# Patient Record
Sex: Female | Born: 1955 | Marital: Married
Health system: Southern US, Community
[De-identification: ages and names within clinical notes are randomized; demographics above are authoritative.]

---

## 2005-06-22 ENCOUNTER — Emergency Department: Payer: Self-pay | Admitting: Emergency Medicine

## 2006-08-23 ENCOUNTER — Emergency Department: Payer: Self-pay | Admitting: Emergency Medicine

## 2007-05-30 ENCOUNTER — Ambulatory Visit: Payer: Self-pay | Admitting: Family Medicine

## 2007-06-06 ENCOUNTER — Emergency Department: Payer: Self-pay | Admitting: Emergency Medicine

## 2007-07-21 ENCOUNTER — Emergency Department: Payer: Self-pay | Admitting: Emergency Medicine

## 2007-07-21 ENCOUNTER — Other Ambulatory Visit: Payer: Self-pay

## 2007-07-26 ENCOUNTER — Inpatient Hospital Stay: Payer: Self-pay | Admitting: Internal Medicine

## 2007-07-26 ENCOUNTER — Other Ambulatory Visit: Payer: Self-pay

## 2008-06-14 ENCOUNTER — Emergency Department: Payer: Self-pay | Admitting: Emergency Medicine

## 2008-06-14 ENCOUNTER — Other Ambulatory Visit: Payer: Self-pay

## 2008-09-30 ENCOUNTER — Inpatient Hospital Stay: Payer: Self-pay | Admitting: Internal Medicine

## 2008-10-26 ENCOUNTER — Inpatient Hospital Stay: Payer: Self-pay | Admitting: Vascular Surgery

## 2009-01-22 ENCOUNTER — Emergency Department: Payer: Self-pay | Admitting: Emergency Medicine

## 2010-03-21 ENCOUNTER — Inpatient Hospital Stay: Payer: Self-pay | Admitting: Internal Medicine

## 2010-05-13 IMAGING — CR DG CHEST 1V PORT
1 series · 1 of 1 positions shown · non-contrast
Comparison: none

REASON FOR EXAM: Shortness of breath
COMMENTS:

[view not recorded]
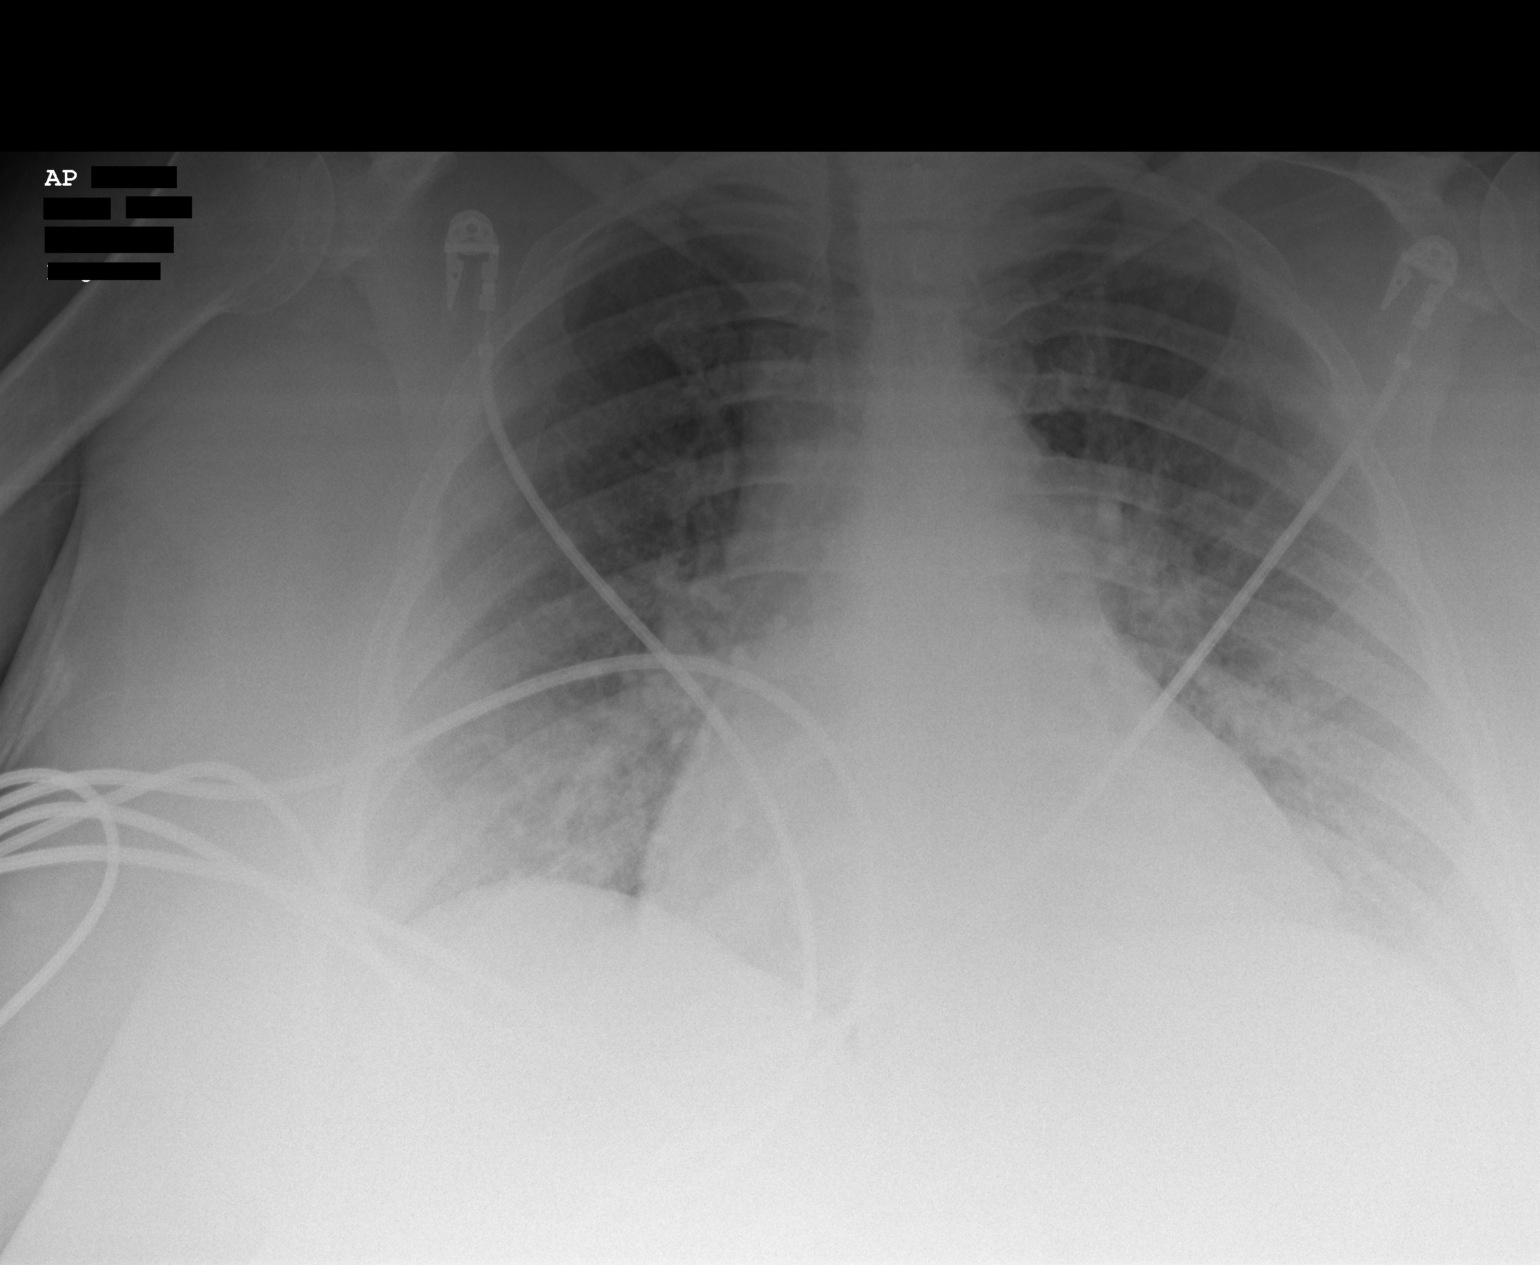

[1 of 1 positions shown; findings below may reference images not displayed]

PROCEDURE:     DXR - DXR PORTABLE CHEST SINGLE VIEW  - September 30, 2008  [DATE]

RESULT:     Comparison is made to a prior study dated 06/14/2008.

There is mild thickening of the interstitial markings without focal regions
of consolidation or focal infiltrates. The patient has taken a shallow
inspiration. The cardiac silhouette is moderately enlarged. The visualized
bony skeleton demonstrates no evidence of fracture or dislocation.
IMPRESSION: Chest radiograph without evidence of acute cardiopulmonary
disease.

## 2010-10-30 ENCOUNTER — Inpatient Hospital Stay: Payer: Self-pay | Admitting: Internal Medicine

## 2010-11-13 ENCOUNTER — Emergency Department: Payer: Self-pay | Admitting: Emergency Medicine

## 2011-07-22 ENCOUNTER — Emergency Department: Payer: Self-pay | Admitting: Emergency Medicine

## 2011-08-14 ENCOUNTER — Ambulatory Visit: Payer: Self-pay | Admitting: Internal Medicine

## 2011-09-04 ENCOUNTER — Ambulatory Visit: Payer: Self-pay | Admitting: Internal Medicine

## 2011-12-14 ENCOUNTER — Inpatient Hospital Stay: Payer: Self-pay | Admitting: Specialist

## 2011-12-14 LAB — CBC
HCT: 28.5 % — ABNORMAL LOW (ref 35.0–47.0)
HGB: 9.3 g/dL — ABNORMAL LOW (ref 12.0–16.0)
MCH: 27.8 pg (ref 26.0–34.0)
MCHC: 32.6 g/dL (ref 32.0–36.0)
MCV: 85 fL (ref 80–100)
Platelet: 227 10*3/uL (ref 150–440)
RDW: 14.8 % — ABNORMAL HIGH (ref 11.5–14.5)
WBC: 9.2 10*3/uL (ref 3.6–11.0)

## 2011-12-14 LAB — COMPREHENSIVE METABOLIC PANEL
Alkaline Phosphatase: 45 U/L — ABNORMAL LOW (ref 50–136)
Bilirubin,Total: 0.3 mg/dL (ref 0.2–1.0)
Calcium, Total: 8.5 mg/dL (ref 8.5–10.1)
Chloride: 97 mmol/L — ABNORMAL LOW (ref 98–107)
Creatinine: 3.44 mg/dL — ABNORMAL HIGH (ref 0.60–1.30)
EGFR (African American): 18 — ABNORMAL LOW
Glucose: 200 mg/dL — ABNORMAL HIGH (ref 65–99)
Potassium: 3.9 mmol/L (ref 3.5–5.1)
Sodium: 139 mmol/L (ref 136–145)
Total Protein: 6.5 g/dL (ref 6.4–8.2)

## 2011-12-14 LAB — IRON AND TIBC
Iron Bind.Cap.(Total): 327 ug/dL (ref 250–450)
Iron Saturation: 19 %
Iron: 63 ug/dL (ref 50–170)
Unbound Iron-Bind.Cap.: 264 ug/dL

## 2011-12-14 LAB — FERRITIN: Ferritin (ARMC): 48 ng/mL (ref 8–388)

## 2011-12-14 LAB — URINALYSIS, COMPLETE
Glucose,UR: 50 mg/dL (ref 0–75)
Hyaline Cast: 4
Nitrite: NEGATIVE
RBC,UR: 6 /HPF (ref 0–5)
Specific Gravity: 1.013 (ref 1.003–1.030)
WBC UR: 4 /HPF (ref 0–5)

## 2011-12-14 LAB — CK TOTAL AND CKMB (NOT AT ARMC): CK, Total: 28 U/L (ref 21–215)

## 2011-12-14 LAB — TROPONIN I: Troponin-I: <0.02 ng/mL

## 2011-12-15 LAB — CBC WITH DIFFERENTIAL/PLATELET
Basophil %: 0.2 %
Eosinophil %: 0 %
HCT: 28.8 % — ABNORMAL LOW (ref 35.0–47.0)
Lymphocyte #: 0.4 10*3/uL — ABNORMAL LOW (ref 1.0–3.6)
Lymphocyte %: 5.9 %
MCH: 27.4 pg (ref 26.0–34.0)
MCHC: 32.3 g/dL (ref 32.0–36.0)
MCV: 85 fL (ref 80–100)
Monocyte #: 0.1 10*3/uL (ref 0.0–0.7)
Monocyte %: 1 %
Neutrophil %: 92.9 %
Platelet: 213 10*3/uL (ref 150–440)
RDW: 15.6 % — ABNORMAL HIGH (ref 11.5–14.5)

## 2011-12-15 LAB — BASIC METABOLIC PANEL
Anion Gap: 8 (ref 7–16)
EGFR (African American): 38 — ABNORMAL LOW
Glucose: 328 mg/dL — ABNORMAL HIGH (ref 65–99)
Osmolality: 305 (ref 275–301)
Potassium: 5.5 mmol/L — ABNORMAL HIGH (ref 3.5–5.1)
Sodium: 140 mmol/L (ref 136–145)

## 2011-12-16 LAB — BASIC METABOLIC PANEL
Anion Gap: 6 — ABNORMAL LOW (ref 7–16)
BUN: 54 mg/dL — ABNORMAL HIGH (ref 7–18)
Calcium, Total: 9.1 mg/dL (ref 8.5–10.1)
EGFR (African American): 55 — ABNORMAL LOW
EGFR (Non-African Amer.): 46 — ABNORMAL LOW
Osmolality: 297 (ref 275–301)
Sodium: 137 mmol/L (ref 136–145)

## 2011-12-17 LAB — BASIC METABOLIC PANEL
Anion Gap: 8 (ref 7–16)
BUN: 49 mg/dL — ABNORMAL HIGH (ref 7–18)
Calcium, Total: 9.5 mg/dL (ref 8.5–10.1)
Chloride: 99 mmol/L (ref 98–107)
Glucose: 91 mg/dL (ref 65–99)
Osmolality: 294 (ref 275–301)

## 2011-12-20 LAB — CULTURE, BLOOD (SINGLE)

## 2012-01-13 ENCOUNTER — Ambulatory Visit: Payer: Self-pay | Admitting: Internal Medicine

## 2012-01-18 ENCOUNTER — Inpatient Hospital Stay: Payer: Self-pay | Admitting: Internal Medicine

## 2012-01-18 LAB — CBC
HCT: 32.6 % — ABNORMAL LOW (ref 35.0–47.0)
HGB: 10.2 g/dL — ABNORMAL LOW (ref 12.0–16.0)
MCHC: 31.3 g/dL — ABNORMAL LOW (ref 32.0–36.0)
MCV: 84 fL (ref 80–100)
Platelet: 188 10*3/uL (ref 150–440)
RBC: 3.88 10*6/uL (ref 3.80–5.20)
RDW: 15.9 % — ABNORMAL HIGH (ref 11.5–14.5)
WBC: 9.5 10*3/uL (ref 3.6–11.0)

## 2012-01-18 LAB — PROTIME-INR: INR: 1

## 2012-01-18 LAB — COMPREHENSIVE METABOLIC PANEL
Bilirubin,Total: 0.3 mg/dL (ref 0.2–1.0)
Chloride: 94 mmol/L — ABNORMAL LOW (ref 98–107)
Co2: 34 mmol/L — ABNORMAL HIGH (ref 21–32)
Creatinine: 1.89 mg/dL — ABNORMAL HIGH (ref 0.60–1.30)
Glucose: 109 mg/dL — ABNORMAL HIGH (ref 65–99)
Osmolality: 287 (ref 275–301)
Potassium: 6 mmol/L — ABNORMAL HIGH (ref 3.5–5.1)
SGOT(AST): 24 U/L (ref 15–37)
SGPT (ALT): 15 U/L
Sodium: 135 mmol/L — ABNORMAL LOW (ref 136–145)
Total Protein: 7.1 g/dL (ref 6.4–8.2)

## 2012-01-18 LAB — CK TOTAL AND CKMB (NOT AT ARMC): CK-MB: 3 ng/mL (ref 0.5–3.6)

## 2012-01-18 LAB — TROPONIN I: Troponin-I: 0.16 ng/mL — ABNORMAL HIGH

## 2012-01-19 LAB — CBC WITH DIFFERENTIAL/PLATELET
Basophil #: 0 10*3/uL (ref 0.0–0.1)
Basophil %: 0.4 %
Eosinophil #: 0.1 10*3/uL (ref 0.0–0.7)
Eosinophil %: 1.1 %
HCT: 29.5 % — ABNORMAL LOW (ref 35.0–47.0)
Lymphocyte %: 8.1 %
MCH: 26.2 pg (ref 26.0–34.0)
MCHC: 31.9 g/dL — ABNORMAL LOW (ref 32.0–36.0)
Monocyte #: 0.5 x10 3/mm (ref 0.2–0.9)
Monocyte %: 5.9 %
Neutrophil #: 6.5 10*3/uL (ref 1.4–6.5)
Platelet: 166 10*3/uL (ref 150–440)

## 2012-01-19 LAB — CK TOTAL AND CKMB (NOT AT ARMC)
CK, Total: 146 U/L (ref 21–215)
CK-MB: 1.5 ng/mL (ref 0.5–3.6)
CK-MB: <0.5 ng/mL — ABNORMAL LOW (ref 0.5–3.6)

## 2012-01-19 LAB — BASIC METABOLIC PANEL
Anion Gap: 2 — ABNORMAL LOW (ref 7–16)
BUN: 56 mg/dL — ABNORMAL HIGH (ref 7–18)
Calcium, Total: 8.7 mg/dL (ref 8.5–10.1)
Co2: 38 mmol/L — ABNORMAL HIGH (ref 21–32)
Creatinine: 1.69 mg/dL — ABNORMAL HIGH (ref 0.60–1.30)
EGFR (African American): 39 — ABNORMAL LOW
Glucose: 96 mg/dL (ref 65–99)
Sodium: 136 mmol/L (ref 136–145)

## 2012-01-19 LAB — MAGNESIUM: Magnesium: 2.2 mg/dL

## 2012-01-19 LAB — POTASSIUM: Potassium: 5.2 mmol/L — ABNORMAL HIGH (ref 3.5–5.1)

## 2012-01-19 LAB — TROPONIN I
Troponin-I: 0.18 ng/mL — ABNORMAL HIGH
Troponin-I: 0.16 ng/mL — ABNORMAL HIGH

## 2012-01-20 LAB — BASIC METABOLIC PANEL
Calcium, Total: 8.4 mg/dL — ABNORMAL LOW (ref 8.5–10.1)
Chloride: 97 mmol/L — ABNORMAL LOW (ref 98–107)
EGFR (African American): 56 — ABNORMAL LOW
EGFR (Non-African Amer.): 48 — ABNORMAL LOW
Glucose: 135 mg/dL — ABNORMAL HIGH (ref 65–99)
Osmolality: 289 (ref 275–301)
Sodium: 138 mmol/L (ref 136–145)

## 2012-01-24 LAB — CULTURE, BLOOD (SINGLE)

## 2012-02-13 ENCOUNTER — Ambulatory Visit: Payer: Self-pay | Admitting: Internal Medicine

## 2012-02-13 DEATH — deceased

## 2013-07-26 IMAGING — US US EXTREM LOW VENOUS BILAT
1 series · 17 of 24 positions shown · non-contrast
Comparison: none

REASON FOR EXAM: LE edema and pain.  Bedbound.  ?? DVT
COMMENTS:

[Series 1: us extrem low venous bilat · 17 of 28 slices shown]
[im 1/28]
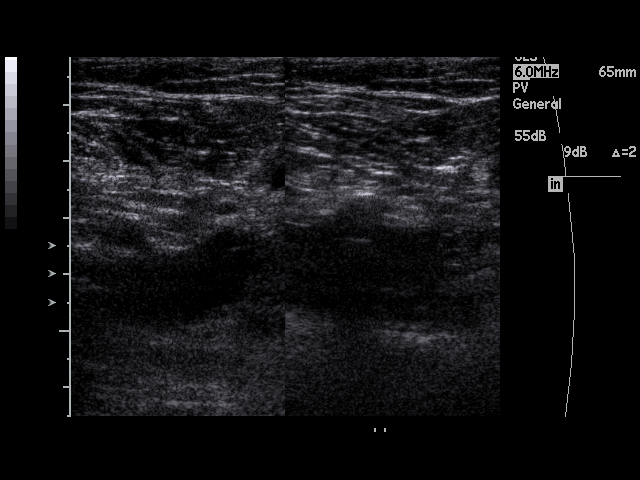
[im 3/28]
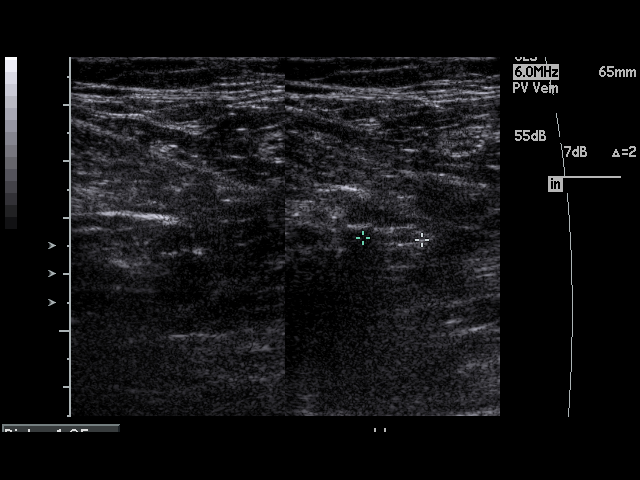
[im 4/28]
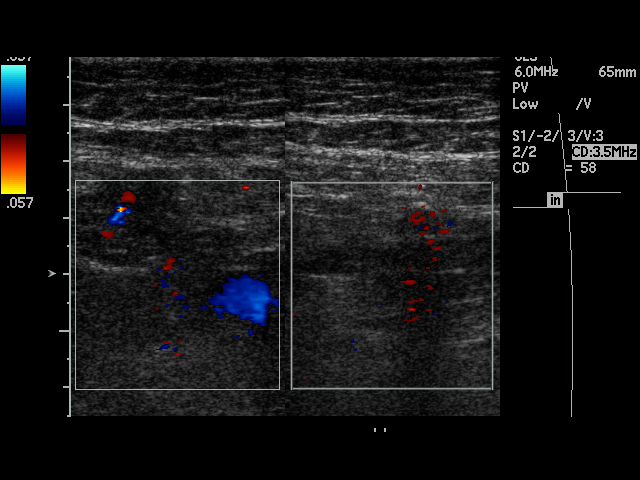
[im 5/28]
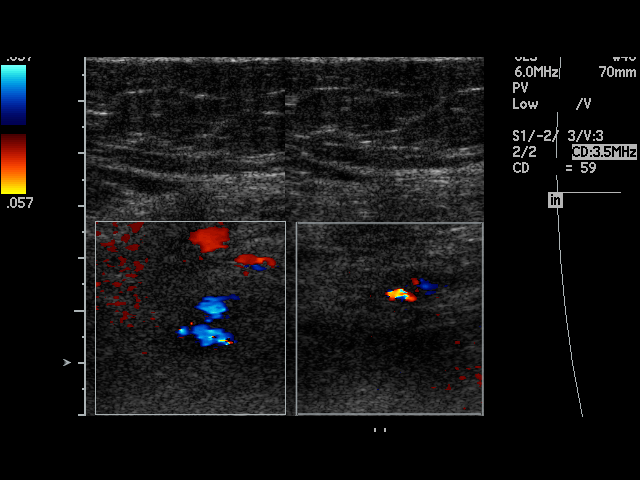
[im 8/28]
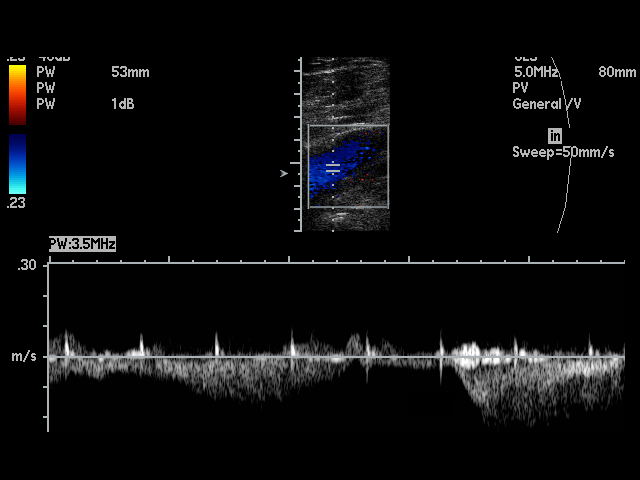
[im 9/28]
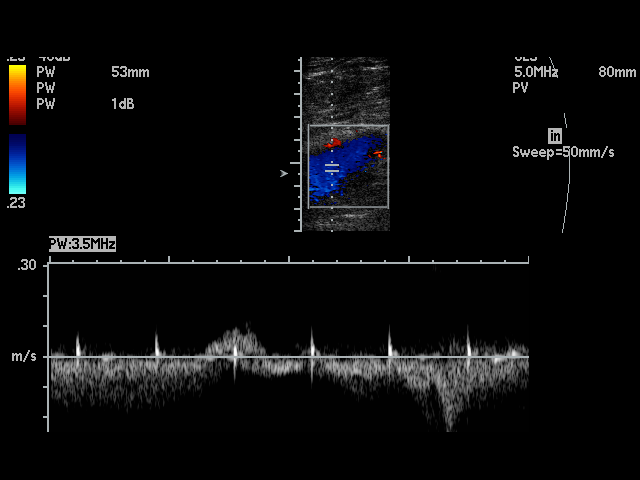
[im 11/28]
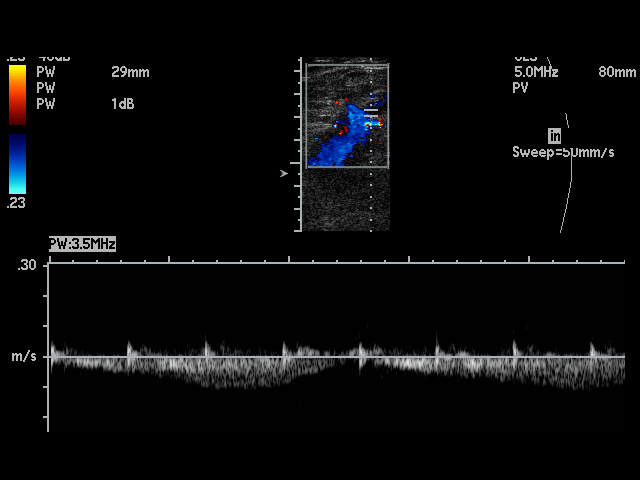
[im 12/28]
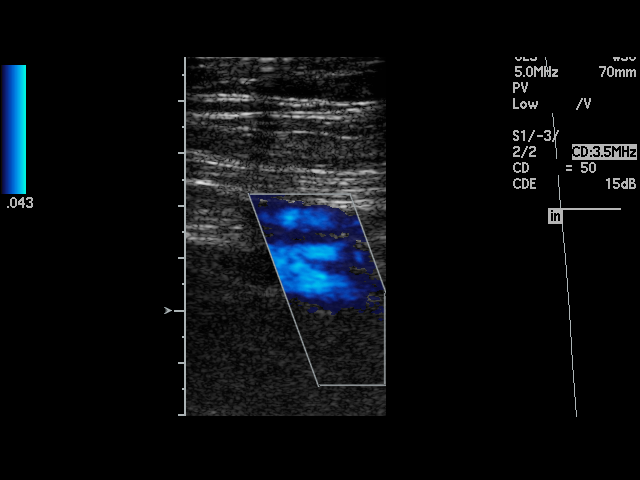
[im 15/28]
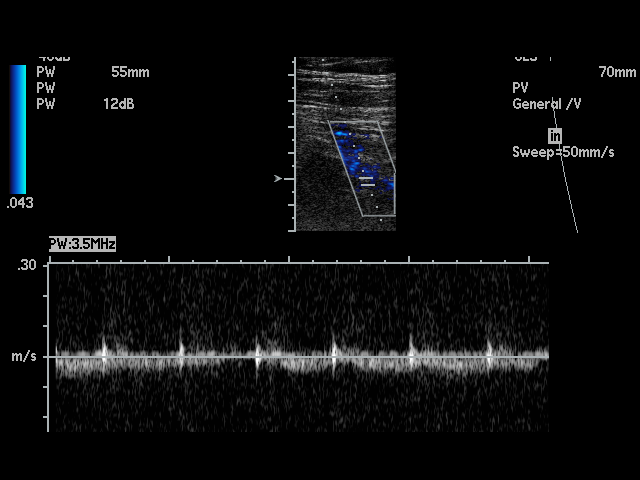
[im 16/28]
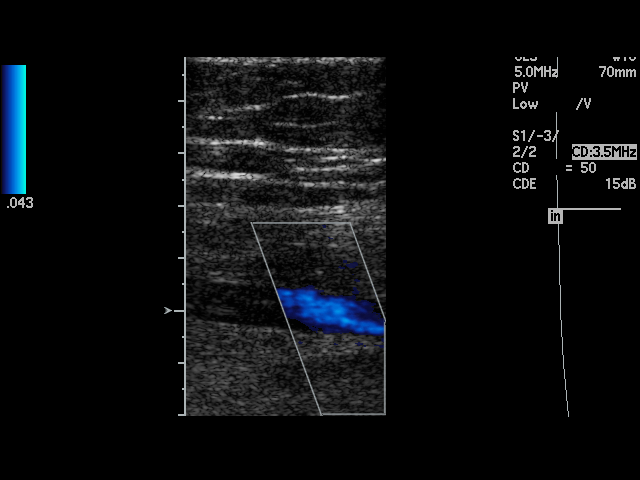
[im 17/28]
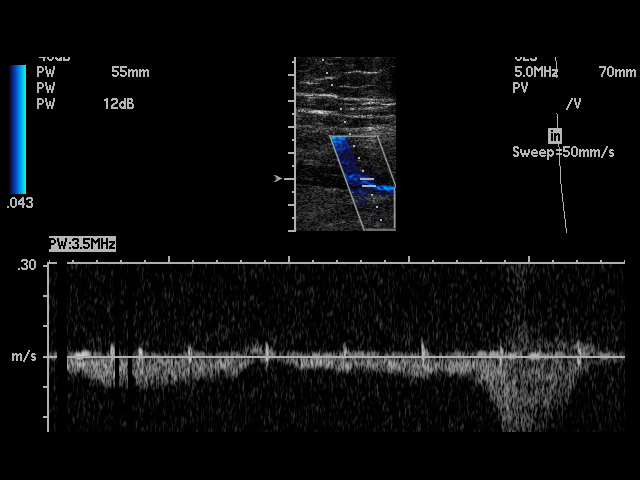
[im 19/28]
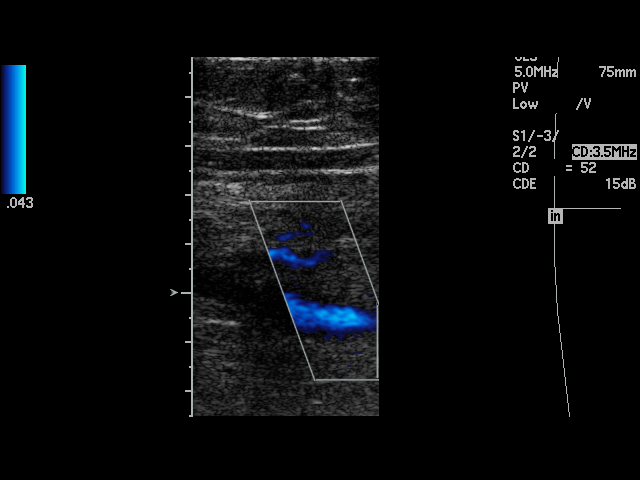
[im 20/28]
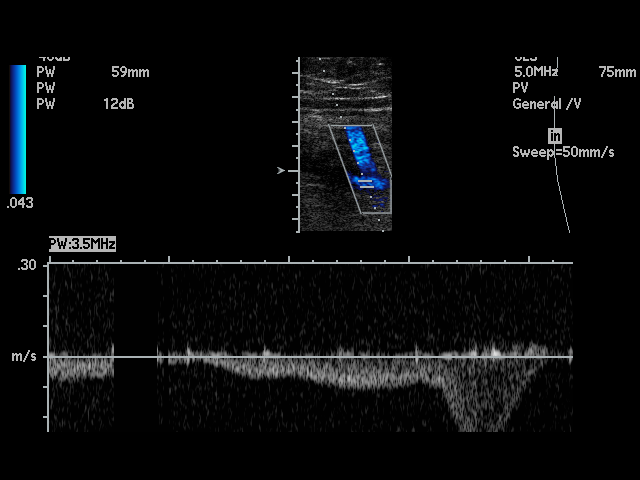
[im 23/28]
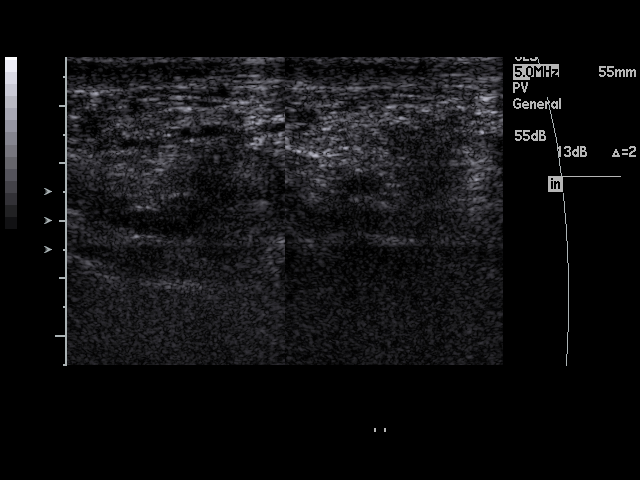
[im 24/28]
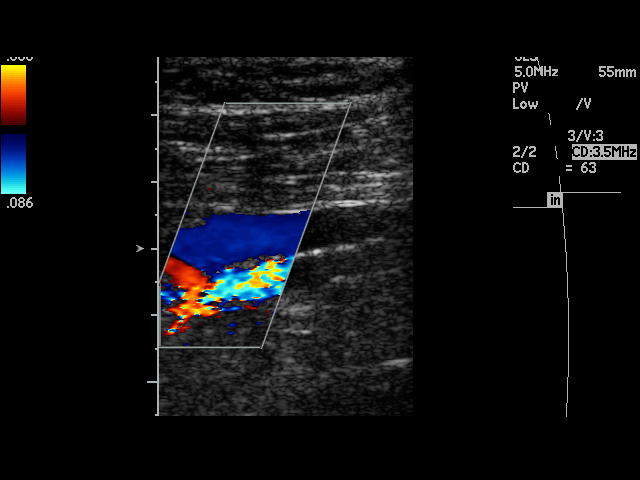
[im 25/28]
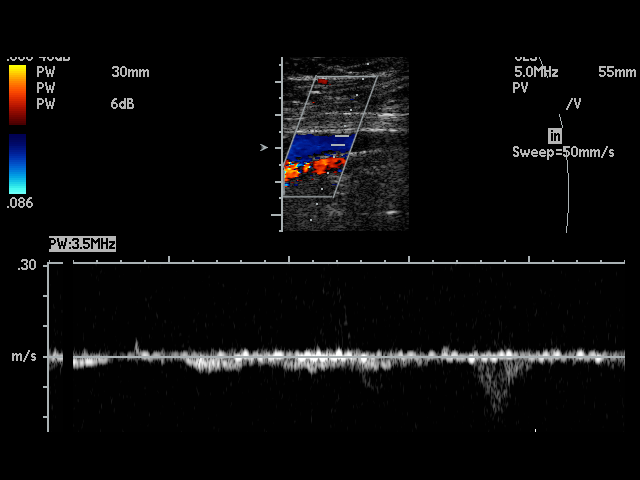
[im 28/28]
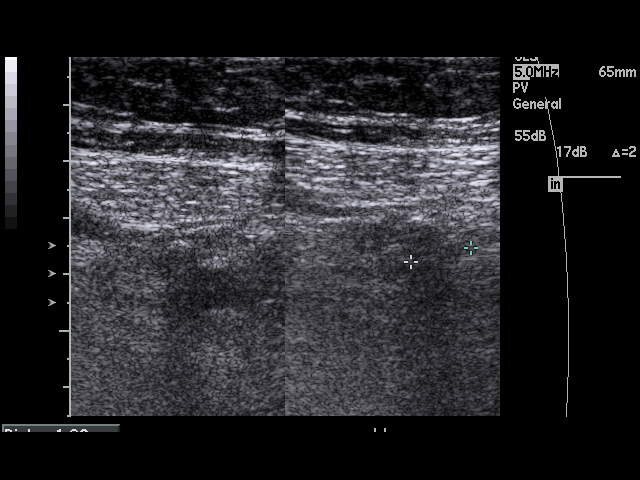

[17 of 24 positions shown; findings below may reference images not displayed]

PROCEDURE:     US  - US DOPPLER LOW EXTR BILATERAL  - December 14, 2011  [DATE]

RESULT:     This is a limited examination as the patient body habitus made
evaluation difficult. The patient refused scan of the left lower extremity.
Flow is noted in the right  femoral venous system. Nonocclusive thrombus
cannot be excluded. The left venous system again cannot be  evaluated as
described above.
IMPRESSION: Limited exam as described above.

## 2013-08-30 IMAGING — CR DG CHEST 1V PORT
1 series · 1 of 1 positions shown · non-contrast
Comparison: none

REASON FOR EXAM: Shortness of Breath
COMMENTS:

PROCEDURE:     DXR - DXR PORTABLE CHEST SINGLE VIEW  - January 18, 2012  [DATE]
RESULT:     Right base atelectasis versus pneumonia. Tracheostomy tube
noted. Cardiac structures normal.

[portable]
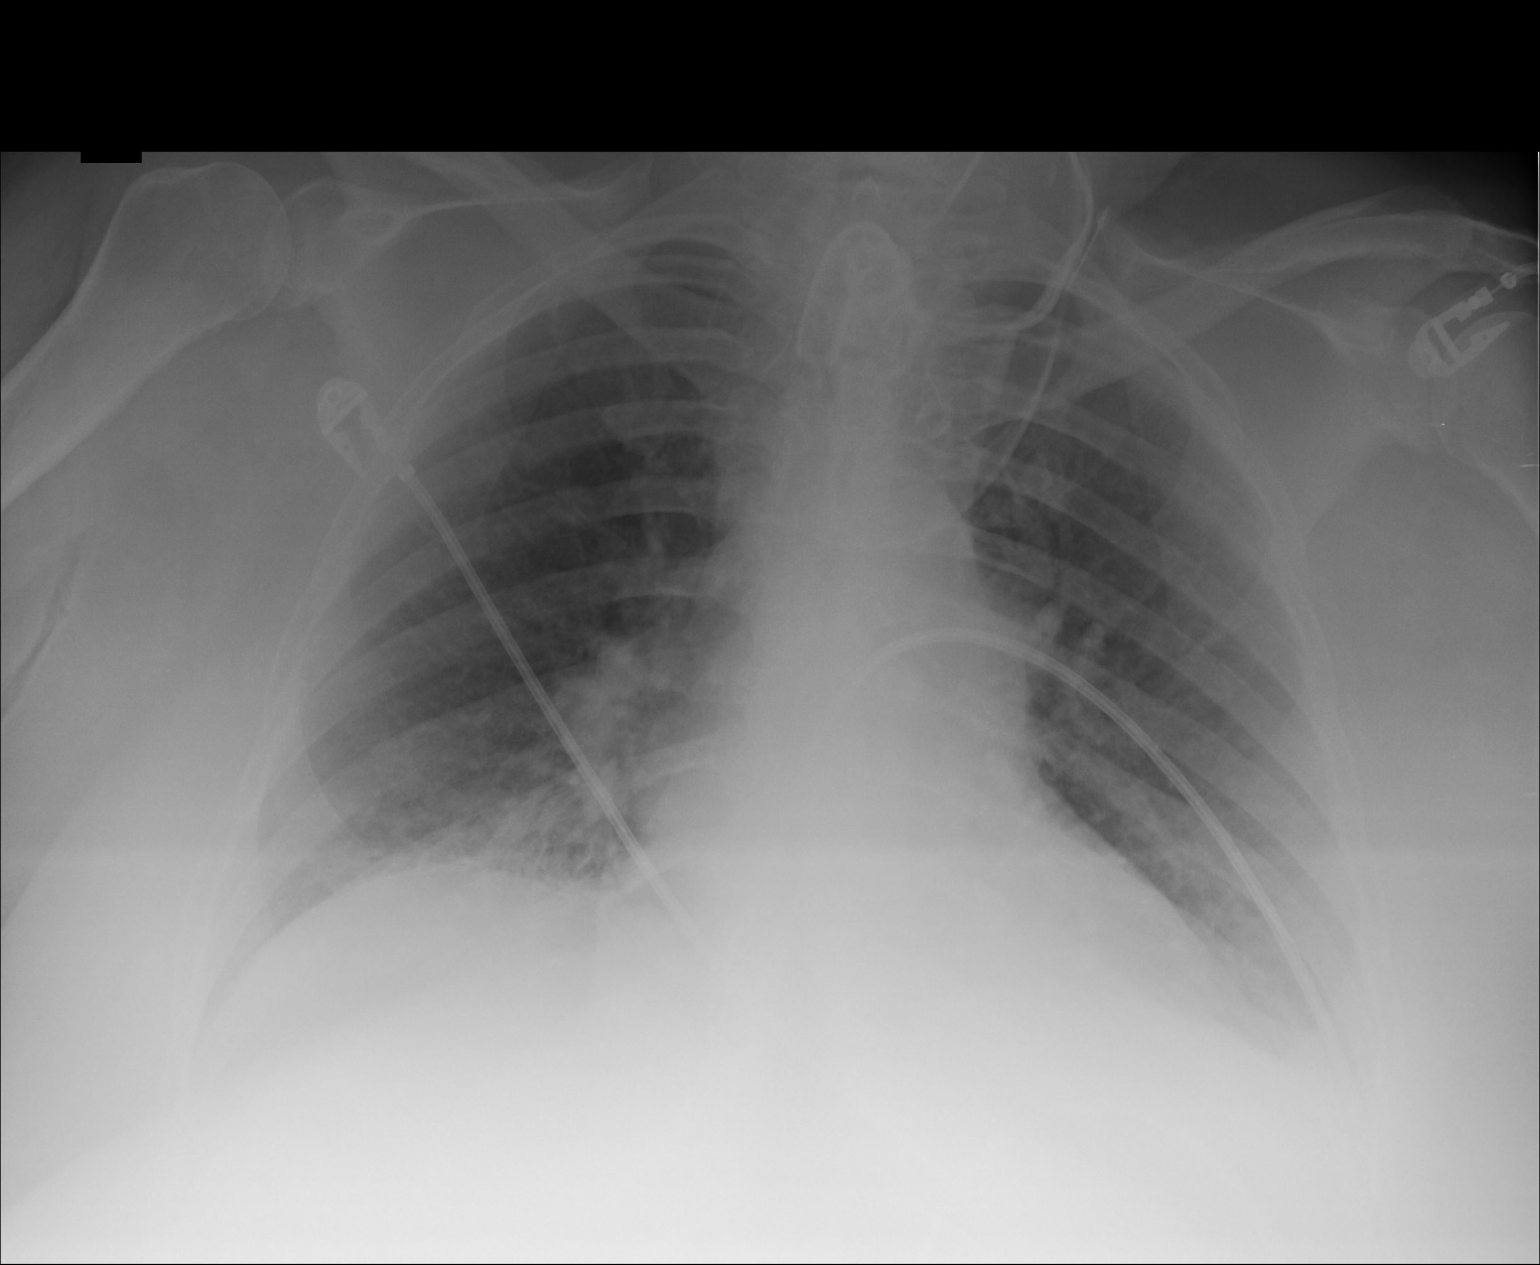

[1 of 1 positions shown; findings below may reference images not displayed]

IMPRESSION: Right base atelectasis versus pneumonia. Tracheostomy tube
in good position. Chest stable from 12/14/2011.

[REDACTED]

## 2015-01-06 NOTE — Discharge Summary (Signed)
PATIENT NAME:  Stephanie JacobsonGEORGE, Carrah MR#:  [id number] DATE OF BIRTH:  1955-11-23  DATE OF ADMISSION:  12/14/2011 DATE OF DISCHARGE:  12/17/2011  For a detailed note, please take a look at the history and physical done on admission.   DISCHARGE DIAGNOSES: 1. Acute on chronic hypoxic hypercarbic respiratory failure.  2. Chronic obstructive pulmonary disease exacerbation.  3. Obstructive sleep apnea.  4. Morbid obesity.  5. Diabetes.  6. Hypertension.  7. Status post tracheostomy.  8. Obesity. 9. Pickwickian syndrome.  10. Acute renal failure.  DIET: The patient is being discharged on a low-sodium, American Diabetic Association, low-fat diet.   ACTIVITY: As tolerated.   DISCHARGE FOLLOWUP: Follow-up with Dr. Cain Sievehristopher Klipstein in [place].   DISCHARGE MEDICATIONS: 1. Lasix 40 mg daily.  2. Sublingual nitroglycerin as needed. 3. Humulin 70/30 on a sliding scale. 4. Albuterol ipratropium nebulizer as needed.  5. Symbicort 1 puff daily.  6. Lisinopril 20 mg daily.  7. Metoprolol titrate 25 mg twice a day.  8. Prednisone taper starting at 50 mg down to 10 mg over the next 10 days.  9. Levaquin 750 mg p.o. daily x7 days. 10. Nystatin powder to be applied to the effected areas twice daily.   CONSULTANTS:  1. Erin FullingKurian Kasa, MD - Pulmonary Critical Care.  2. Mosetta PigeonHarmeet Singh, MD - Nephrology.  PERTINENT STUDIES: X-ray done on admission showed poor inspiratory effort with bilateral bibasilar atelectasis.   Ultrasound of the lower extremities, which was a limited examination given her morbid obesity, showed a limited exam as she refused examination of the left lower extremity, but there was no evidence of any DVT on the right side.   Ultrasound of the kidneys showed limited examination, no hydronephrosis, and mildly increased echogenicity of the renal cortex suggestive of possible chronic renal disease.   HOSPITAL COURSE: This is a 59 year old female with multiple medical problems as  mentioned above who presented to the hospital on 12/14/2011 secondary to shortness of breath, cough, and respiratory failure. She was also incidentally noted to be in renal failure.  1. Acute on chronic hypoxic/hypercarbic respiratory failure: This was likely thought to be related to chronic obstructive pulmonary disease exacerbation and possible underlying pneumonia. The patient initially was started on broad-spectrum antibiotics with vancomycin and Zosyn, also placed on IV steroids, and admitted to the intensive care unit. She was only in the intensive care unit for one day. After getting aggressive therapy, her symptoms improved. Her steroids were then tapered. Her blood cultures and sputum cultures have remained negative. Her antibiotics were tapered from vancomycin and Zosyn to Levaquin. Therefore, she is being discharged presently on a prednisone taper and Levaquin as stated along with her nebulizer treatments as mentioned. She already has a tracheostomy and she is on 28% trach collar at home. Her FiO2 requirements are at baseline now.  2. Chronic obstructive pulmonary disease exacerbation: This was treated again with IV steroids, around-the-clock nebulizer treatments, and empiric antibiotics. As mentioned, it has clinically improved. Her blood cultures have been negative. She currently is now back down to her baseline oxygen requirements at 28% trach collar. She is being discharged on a prednisone taper and Levaquin as stated.  3. Diabetes: When the patient presented to the hospital, she was n.p.o. and was initially just placed on sliding scale insulin. She did develop some significant hyperglycemia being on steroids. She was eventually started back on her insulin 70/30. The dose she takes at home is pretty high, which is 90 units in the  morning and 85 units in the evening. She will resume that. Her blood sugar since being started on insulin has significantly improved.  4. Hypertension: The patient was  maintained on her metoprolol and her blood pressure has remained stable. She will resume her metoprolol and lisinopril now as her renal function is improved.  5. Acute renal failure: When the patient presented to the emergency room her creatinine was as high as 3.4. Her baseline is within normal limits. She was gently hydrated with IV fluids. Her Lasix and ACE inhibitors were held. Her renal function since then has improved and therefore her diuretics and ACE inhibitors have now been resumed. Her renal ultrasound did not show any evidence of obstruction. A nephrology consult was obtained and as per them this was likely ATN.  6. Fungal infection: Given the patient's morbid obesity, she has had a fungal infection in her groin area and below her breasts and axillary regions, and we have been applying nystatin powder and she will resume that at home.   The patient is being discharged to home with home health nursing and physical therapy services. She was followed by [facility] agency, but they have refused to come out and see her given her living conditions. Case management has made a referral to [place] as a new agency and they will continue to follow her as an outpatient. The patient would benefit from short-term rehab, but she refuses to go to rehab.   TIME SPENT: 40 minutes.  ____________________________ Rolly Pancake. Cherlynn Kaiser, MD vjs:slb D: 12/17/2011 10:23:30 ET T: 12/17/2011 13:50:03 ET JOB#: [id number]  cc: Rolly Pancake. Cherlynn Kaiser, MD, <Dictator> Christopher Klipstein in [place].  Houston Siren MD ELECTRONICALLY SIGNED 12/18/2011 14:33

## 2015-01-06 NOTE — H&P (Signed)
PATIENT NAME:  Stephanie Jordan, PRATT MR#:  [id number] DATE OF BIRTH:  1956-03-12  DATE OF ADMISSION:  01/18/2012  REFERRING PHYSICIAN: Emergency Room physician, Dr. Mayford Knife    PCP: [facility]   CHIEF COMPLAINT: Respiratory distress.   HISTORY OF PRESENT ILLNESS: The patient is a 59 year old female with history of chronic respiratory failure secondary to COPD, morbid obesity, sleep apnea, status post tracheostomy, diabetes, hypertension, obesity, and hypoventilation syndrome who was last admitted to [facility] 12/14/2011 for acute on chronic respiratory failure due to COPD exacerbation. The patient was brought to the Emergency Room by EMS for respiratory distress. The patient is currently lethargic. There is no family at bedside. The majority of the information was obtained from the patient's ER nurse, ER physician, and old medical records. The patient was found to be hypercarbic and BiPAP was placed on the patient to see if it would help her, however, since she had a tracheostomy the BiPAP was not effective. Her tracheostomy tube was changed to a cuffed tube and she was placed on mechanical ventilation. Her chest x-ray showed possible right lower lobe pneumonia. The patient is followed by [facility]   ALLERGIES: Altace, Avelox, Betadine.   PAST MEDICAL HISTORY:  1. Chronic respiratory failure secondary to COPD. 2. Sleep apnea.  3. Diabetes.  4. Hypertension. 5. 12/2011 the patient also had acute renal failure due to ATN.  6. Hypoventilation syndrome status post tracheostomy. 7. Morbid obesity.  8. Chronic lower extremity edema. 9. Chronic pain syndrome.  10. History of CVA in the past. 11. History of smoking in the past.   12. Polycystic ovarian syndrome.   SOCIAL HISTORY: Ex-smoker. No history of alcohol or drug abuse. Lives by herself as per old records.   FAMILY HISTORY: Father had diabetes. Mother died from Tylenol toxicity.   MEDICATIONS AS PER PRESCRIPTION WRITER:    1. Lasix 40 mg daily.  2. Nitroglycerin p.r.n.  3. Humulin 70/30 subcutaneous sliding scale. 4. DuoNebs p.r.n.  5. Lopressor 25 mg b.i.d.  6. Sodium chloride nebulize once a day. 7. Nystatin as needed.  8. Avelox 400 mg daily although Avelox is listed as one of her allergies. 9. Pepcid 20 mg b.i.d.  10. Ativan 0.5 mg q.4 to 6 hours p.r.n.   11. Morphine 20 mg/mL 0.5 mL q.4 hours p.r.n.   REVIEW OF SYSTEMS: The patient is very lethargic. She is unable to provide review of systems.   PHYSICAL EXAMINATION:   VITAL SIGNS: Temperature 98, heart rate 83, respiratory rate 22, blood pressure 156/98, pulse oximetry 100%.   GENERAL: The patient is a morbidly obese chronically ill appearing female who is very lethargic. She is on the mechanical ventilator. She has a cuffed tracheal tube.   HEAD: Atraumatic, normocephalic.   EYES: There is some pallor. No icterus or cyanosis. Pupils equal, round, and reactive to light and accommodation.    ENT: The patient would not open her mouth for an evaluation.   NECK: Supple. Cuffed tracheostomy tube in place.   CHEST WALL: No tenderness to palpation. Not using accessory muscles for respiration. No intercostal muscle retractions.    LUNGS: Bilateral wheezing and rhonchi.   CARDIOVASCULAR: S1, S2 regular. Distant heart sounds. Exam difficult due to body habitus.   ABDOMEN: Morbidly obese, soft. No guarding. No rigidity. Bowel sounds are difficult to appreciate due to body habitus. The patient has a large abdominal wall pannus with some redness.   SKIN: The patient has a stage II to III decubitus  on her coccyx. No other rashes or lesions.   PERIPHERIES: There is 2+ pedal edema, 1+ pedal pulses.   MUSCULOSKELETAL: No cyanosis or clubbing.   NEUROLOGIC: Very lethargic, unable to do a complete neurological exam.   PSYCH: Unable to evaluate.   LABORATORY, DIAGNOSTIC, AND RADIOLOGICAL DATA: ABG pH 7.24, pCO2 92, pO2 99, FiO2 40%. Lactic acid 0.80.  Chest x-ray right base atelectasis versus pneumonia. CK 186. Troponin 0.16. White count 9.5, hemoglobin 10.2, hematocrit 32.6, platelet count 188, glucose 109, BUN 58, creatinine 1.89, sodium 135, potassium 6.0, chloride 94, CO2 34. BNP 1863. Chest x-ray shows possible right base atelectasis versus pneumonia.   ASSESSMENT AND PLAN: The patient is a 59 year old female with history of morbid obesity, hypoventilation syndrome, chronic respiratory failure status post tracheostomy, hypertension, and diabetes brought in by EMS for respiratory distress.  1. Hypercarbic respiratory failure. The patient is currently on the mechanical ventilator. Will continue ventilator protocol. Add Combivent, Flovent, and Peridex. Get a Pulmonary consultation and repeat an ABG in a.m.  2. Right base atelectasis versus pneumonia. Will obtain blood cultures and start on empiric antibiotics, vancomycin and Zosyn to cover for nosocomial bacteria since the patient was in the hospital recently.  3. Anemia. This is likely chronic, appears to be at baseline. Will monitor.  4. Diabetes. The patient is going to be n.p.o. Will place on an insulin sliding scale with Accu-Cheks q.6 hours. Will also obtain a dietary consultation to initiate tube feeds. 5. Acute renal failure. During the patient's last admission, she had acute renal failure with a creatinine of 3.4 which was felt to be due to ATN. Her kidney failure has subsequently normalized. Her creatinine is up today. Will give very gentle IV fluid hydration. Monitor strict I's and O's. Place a Foley catheter. Avoid nephrotoxic medications. 6. Mild hyponatremia. Will monitor closely.  7. Hyperkalemia. The patient is not any medications which could cause hyperkalemia. She was not on any oral potassium supplementation. Will treat with Kayexalate, one dose of Lasix, bicarbonate, calcium gluconate, insulin, and D50. Will repeat potassium level in a.m. Will monitor on telemetry since the patient  is at high risk of arrhythmias given the hyperkalemia.  8. Elevated troponin. The patient is not able to provide a history if she has any chest pain. Could be demand ischemia versus NSTEMI versus poor clearance due to acute renal failure. Will check serial cardiac enzymes. Start on aspirin and beta-blocker. Will obtain an echo and Cardiology consultation.  9. Elevated BNP. Chest x-ray doesn't show florid pulmonary edema. The patient has gotten one dose of Lasix in the ED. Will avoid giving any further diuretics until an echo is obtained in view of her acute renal failure. 10. Hypertension. Will continue beta-blocker withholding parameters.   Reviewed all medical records, discussed with the ED physician.   CRITICAL CARE TIME SPENT: 50 minutes.   ____________________________ Darrick MeigsSangeeta Devany Aja, MD sp:drc D: 01/18/2012 20:42:19 ET T: 01/19/2012 09:11:10 ET JOB#: [id number]  cc: Darrick MeigsSangeeta Corben Auzenne, MD, <Dictator> Darrick MeigsSANGEETA Gretel Cantu MD ELECTRONICALLY SIGNED 01/20/2012 16:29

## 2015-01-06 NOTE — Consult Note (Signed)
Consult dictated, [id number] came with respiratory failure and was placed on ventilator. She has mildly elevated troponin, no acute EKG changes, and denies chest pain. Give the setting elevated troponin is due to demand ischaemia.  Electronic Signatures: Radene KneeKhan, Shivani Barrantes Ali (MD)  (Signed on 07-May-13 08:46)  Authored  Last Updated: 07-May-13 08:46 by Radene KneeKhan, Shadiyah Wernli Ali (MD)

## 2015-01-06 NOTE — H&P (Signed)
PATIENT NAME:  Stephanie Jordan, EBEY MR#:  [id number] DATE OF BIRTH:  03/03/1956  DATE OF ADMISSION:  12/14/2011  PRIMARY CARE PHYSICIAN: She does not have one.   CHIEF COMPLAINT: Shortness of breath, cough, chills, and weakness.   HISTORY OF PRESENT ILLNESS: This is a 59 year old female who presents from home due to significant shortness of breath, cough ongoing for the past few days. The patient is fairly drowsy, therefore, the history is fairly limited. The patient has underlying morbid obesity and pickwickian syndrome and chronic respiratory failure and status post tracheostomy four months ago. She presents with shortness of breath, cough, and congestion. At triage she was noted to be hypoxic and also noted to be slightly hypotensive. She was placed on her trach collar about 5 liters. Chest x-ray findings are limited but suggestive of possible pneumonia. She was started on IV antibiotics. She was also noted to be in acute renal failure and she claims that she has been having difficulty urinating also for the past few days. Hospitalist services were then contacted for further treatment and evaluation. The patient does complain of vague abdominal pain and also complained of some diarrhea that she had yesterday. She has also been having trouble urinating for the past few days. She admits to some chills but no other associated symptoms presently.   REVIEW OF SYSTEMS: CONSTITUTIONAL: No documented fever. No weight gain, no weight loss. EYES: No blurry or double vision. ENT: No tinnitus or postnasal drip. No redness of the oropharynx. RESPIRATORY: Positive cough. No wheeze, no hemoptysis. Positive dyspnea. CARDIOVASCULAR: No chest pain, no orthopnea, no palpitations, no syncope. GI: No nausea, no vomiting. Positive diarrhea. No abdominal pain, no melena, no hematochezia. GU: No dysuria, no hematuria. ENDOCRINE: No polyuria or nocturia. No heat or cold intolerance. HEME: Positive anemia. No acute bruising or bleeding.  INTEGUMENTARY: No rashes, no lesions. MUSCULOSKELETAL: No arthritis, no swelling, no gout. NEUROLOGIC: No numbness, no tingling, no ataxia, no seizure-type activity. PSYCH: No anxiety, no insomnia, no ADD.   PAST MEDICAL HISTORY:  1. Chronic respiratory failure secondary to chronic obstructive pulmonary disease.  2. Chronic obstructive pulmonary disease, oxygen dependent.  3. Status post tracheostomy four months ago. 4. Type II diabetes. 5. Morbid obesity. 6. Hypertension. 7. Chronic lower extremity edema. 8. Chronic pain. 9. History of tobacco abuse. 10. History of previous cerebrovascular accident. 11. Polycystic ovarian syndrome.   ALLERGIES: Altace, Avelox, and Betadine.   SOCIAL HISTORY: Used to be a smoker, quit a while ago. No alcohol abuse. No illicit drug abuse. Lives by herself.   FAMILY HISTORY: Father has diabetes, is alive. Mother died from Tylenol toxicity.   CURRENT MEDICATIONS:  1. Albuterol ipratropium nebulizer as needed.  2. Lasix 40 mg daily.  3. Humulin 70/30 on a sliding scale. 4. Lisinopril 20 mg daily.  5. Metoprolol tartrate 25 mg b.i.d.  6. Sublingual nitroglycerin as needed.  7. Symbicort 2 puffs b.i.d.   PHYSICAL EXAMINATION ON ADMISSION:   VITAL SIGNS: Temperature 99.8, pulse 89, respirations 20, blood pressure 113/53, sats 95% on 5 liters trach collar.   GENERAL: She is a morbidly obese female in bed in mild respiratory distress.   HEENT: Atraumatic, normocephalic. Her extraocular muscles are intact. Pupils equal and reactive to light. Sclerae anicteric. No conjunctival injection. No oropharyngeal erythema.   NECK: Short and fat. Difficult to check JVD. No lymphadenopathy. No thyromegaly. Status post tracheostomy.   HEART: Regular rate and rhythm. Tachycardic. No murmurs, no rubs, no clicks.   LUNGS: She  has some wheezing and rhonchi anteriorly. Positive use of accessory muscles. No dullness to percussion.   ABDOMEN: Soft, flat, nondistended,  nontender. Good bowel sounds. No hepatosplenomegaly appreciated.   EXTREMITIES: She does have +2 pitting edema from the knees to the ankles bilaterally. +2 pedal and radial pulses.   NEUROLOGIC: She is alert, awake, and oriented x3. Globally weak. Moves all extremities. Difficult to do a full neurological exam.   SKIN: Moist and warm with no rashes.   LYMPHATIC: There is no cervical or axillary lymphadenopathy.   LABORATORY, DIAGNOSTIC, AND RADIOLOGICAL DATA: Serum glucose 200, BUN 54, creatinine 3.4, sodium 139, potassium 3.9, chloride 97, bicarb 30. LFTs are within normal limits. Albumin 3.1. Troponin less than 0.02. White cell count 9.2, hemoglobin 9.3, hematocrit 28.5, platelet count 227. ABG showed a pH of 7.24, pCO2 71, pO2 224, sats 99%. The patient did have a chest x-ray done which showed poor inspiratory effort with basilar atelectasis, mild pneumonia cannot be excluded.   ASSESSMENT AND PLAN: This is a 59 year old female with a history of chronic obstructive pulmonary disease, oxygen dependent, morbid obesity, chronic respiratory failure, diabetes, and polycystic ovarian syndrome who presented to the hospital due to shortness of breath, cough, and respiratory failure, also noted to be in acute renal failure.  1. Acute on chronic hypoxic hypercarbic respiratory failure. This is likely suspected due to underlying chronic obstructive pulmonary disease exacerbation from suspected pneumonia even though her chest x-ray findings are not suggestive of it but it's a very poor film. For now will empirically start her on IV steroids. Continue around-the-clock nebulizer treatments. Continue her Symbicort. I will place her empirically on vancomycin and Zosyn. Follow blood and sputum cultures. The patient already has a tracheostomy. I will follow serial blood gases. If needed will put her on a ventilator. I have discussed the case with Dr. Belia HemanKasa from pulmonary who will also follow the patient.  2. Chronic  obstructive pulmonary disease exacerbation likely suspected due to underlying pneumonia. I will treat her aggressively with IV steroids, around-the-clock nebulizer treatments. Continue Symbicort. Empirically treat with IV vancomycin and Zosyn. Follow serial ABGs.  3. Acute renal failure. Her baseline creatinine is around 0.8, currently elevated at 3.4. She does say that she has been having difficulty urinating for the past few days. Therefore, concern for possible obstruction. I will have the nursing staff place a Foley. Follow urinalysis. Follow BUN and creatinine and urine output. Hold her Lasix and ACE inhibitor for now. Also, get a Nephrology consult and get a renal ultrasound.  4. Diabetes. The patient is presently going to be n.p.o. given her respiratory failure. Place her on insulin sliding scale. Hold her Humulin 70/30 for now.  5. Lower extremity edema and pain. I suspect this is all dependent edema from her poor mobility but I will go ahead and get Doppler's to rule out underlying DVT. Follow her clinically.  6. Anemia. Her hemoglobin during her previous hospitalization was much improved. I will check a Hemoccult. Follow iron studies. Follow her hemoglobin. No need for transfusion at this time.  7. Hypotension, improved with some IV fluids. I will continue fluids for now. Follow hemodynamics. Hold her antihypertensives and diuretics for now. There is some concern for septic shock as she may have some underlying pneumonia as mentioned.   CODE STATUS: The patient is a FULL CODE.   The plan was discussed with the patient and she is in agreement.   TIME SPENT: 50 minutes.  ____________________________ Rolly PancakeVivek J. Myha Arizpe,  MD vjs:drc D: 12/14/2011 12:40:19 ET T: 12/14/2011 13:42:30 ET JOB#: [id number]  cc: Rolly Pancake. Cherlynn Kaiser, MD, <Dictator> Houston Siren MD ELECTRONICALLY SIGNED 12/15/2011 8:07

## 2015-01-06 NOTE — Discharge Summary (Signed)
PATIENT NAME:  Stephanie JacobsonGEORGE, Stephanie Jordan MR#:  [id number] DATE OF BIRTH:  [birth year]  DATE OF ADMISSION:  01/18/2012 DATE OF DISCHARGE:  01/20/2012  DIAGNOSES:  1. Acute on chronic respiratory failure possibly due to chronic obstructive pulmonary disease and also medications. Patient is on high doses of morphine, Ativan. 2. Elevated troponin likely due to demand ischemia.  3. Chronic obstructive pulmonary disease. 4. Acute renal failure, resolved.  5. Hypokalemia, resolved.  6. Diabetes.  7. Chronic lower extremity edema.  8. Anemia.   DISPOSITION: Patient is DO NOT RESUSCITATE and is being discharged to hospice facility.   DIET: Pureed.   ACTIVITY: As tolerated.   CONSULTATIONS: 1. Palliative care consultation with Dr. Harvie JuniorPhifer. 2. Cardiology consultation with Dr. Adrian BlackwaterShaukat Khan.  3. Critical care consultation with Dr. Belia HemanKasa.  LABORATORY, DIAGNOSTIC, AND RADIOLOGICAL DATA: Chest x-ray showed right base atelectasis versus pneumonia. Microbiology: Blood cultures no growth so far. CBC normal other than anemia of chronic disease. Hemoglobin 10.2. BNP 1863 on admission. Creatinine 1.89 on admission, normal by the time of discharge. Potassium 6 on admission, 4.9 at the time of discharge. Cardiac enzymes ranging from 0.16 to 0.18.   HOSPITAL COURSE: Patient is a 59 year old female with multiple medical problems including morbid obesity, sleep apnea, status post tracheostomy, chronic respiratory failure, diabetes, polycystic ovarian disease who presented with acute on chronic respiratory failure and shortness of breath. Patient was hypoxic and hypercarbic on admission. She had a cuffless trach. Her trach was changed to cuffed and she was placed on the ventilator with significant improvement.  The respiratory failure was felt to be due to chronic obstructive pulmonary disease exacerbation and possibly also due to medications since she was on high doses of morphine and Ativan. Pulmonary consultation with Dr. Belia HemanKasa  was obtained and patient was weaned off the ventilator and is currently on a trach collar. She had mildly elevated troponins for which a cardiology consultation was obtained. The cardiologist felt that the elevated troponins were due to demand ischemia. Patient's chronic obstructive pulmonary disease exacerbation improved with nebulizer treatment. She also had acute renal failure and hypokalemia which resolved with conservative management. She has chronic lower extremity edema and her Doppler's have been negative in the past for deep venous thrombosis. [facility] and her overall prognosis was guarded therefore a palliative care consultation was obtained. After discussion with the patient and her family patient decided on DO NOT RESUSCITATE status and hospice home placement where she is being discharged to in a stable condition.   TIME SPENT: 45 minutes.   ____________________________ Darrick MeigsSangeeta Zell Hylton, MD sp:cms D: 01/20/2012 16:23:26 ET T: 01/21/2012 10:56:46 ET JOB#: [id number]  cc: Darrick MeigsSangeeta Stina Gane, MD, <Dictator> Darrick MeigsSANGEETA Hendryx Ricke MD ELECTRONICALLY SIGNED 01/21/2012 14:14

## 2015-01-06 NOTE — Consult Note (Signed)
PATIENT NAME:  Stephanie Jordan, Stephanie Jordan MR#:  [id number] DATE OF BIRTH:  06-24-1956  DATE OF CONSULTATION:  01/19/2012  REFERRING PHYSICIAN:   CONSULTING PHYSICIAN:  Laurier NancyShaukat A. Zane Samson, MD  HISTORY OF PRESENT ILLNESS: This is a 59 year old white female with a past medical history of obstructive sleep apnea, tracheostomy, diabetes mellitus, and hypertension who came in with respiratory distress and was converted from the tracheostomy to the ventilator. I was asked to evaluate the patient because of elevated troponin. She is currently on the ventilator and is unable to talk, but she is able to understand. On asking questions to her, she denies any chest pain and admits to being short of breath when she came in, and her shortness of breath is getting better.   PAST MEDICAL HISTORY:  1. History of sleep apnea. 2. Tracheostomy. 3. Diabetes. 4. Hypertension. 5. History of hyperkalemia.  6. History of acute renal failure, on 04/13.  NOTE: Currently she is appears to be quite comfortable. The rest of the history is unobtainable, like social history, family history, and allergies.   PHYSICAL EXAMINATION:  VITALS: Blood pressure 126/43, respirations 29, pulse 80, temperature 99.3, and saturation 93.   HEENT: The patient has a tracheostomy.   LUNGS: Good air entry.   HEART: Regular rate and rhythm. Normal S1 and S2. No audible murmur.   ABDOMEN: Soft and nontender, positive bowel sounds.   EXTREMITIES: 1+ pedal edema.   LABS/STUDIES: EKG shows normal sinus rhythm, nonspecific ST-T changes.   BUN and creatinine are 56 and 1.69. Initial troponin was 0.16 and now is 0.18. Initial hemoglobin was 10.2 and now is 9.4.  pH is 7.54, pCO2 43, and pO2 49.   ASSESSMENT AND PLAN: The patient has respiratory failure due to chronic obstructive pulmonary disease and obstructive sleep apnea who came in being hypoxic as well as having hypercarbic respiratory failure. In the setting like that, a mildly elevated troponin  such as this is most likely not due to non-STEMI and demand ischemia. Advise treating the underlying cause. The patient has been put on a respirator and seems to be improving. EKG has no acute changes. We will make further recommendations based on findings on echocardiogram, which has been ordered. At this time, no specific treatment for mildly elevated troponin is needed or any further workup, other than doing an echocardiogram. Thank you very much for the referral. ____________________________ Laurier NancyShaukat A. Codee Tutson, MD sak:slb D: 01/19/2012 08:43:14 ET T: 01/19/2012 09:26:40 ET JOB#: [id number]  cc: Laurier NancyShaukat A. Alyss Granato, MD, <Dictator> Laurier NancySHAUKAT A Kamylah Manzo MD ELECTRONICALLY SIGNED 01/19/2012 16:45
